# Patient Record
Sex: Female | Born: 1950 | Race: White | Hispanic: No | Smoking: Never smoker
Health system: Southern US, Community
[De-identification: ages and names within clinical notes are randomized; demographics above are authoritative.]

## PROBLEM LIST (undated history)

## (undated) DIAGNOSIS — E119 Type 2 diabetes mellitus without complications: Secondary | ICD-10-CM

## (undated) DIAGNOSIS — G4730 Sleep apnea, unspecified: Secondary | ICD-10-CM

## (undated) DIAGNOSIS — G2581 Restless legs syndrome: Secondary | ICD-10-CM

## (undated) DIAGNOSIS — R011 Cardiac murmur, unspecified: Secondary | ICD-10-CM

## (undated) DIAGNOSIS — I10 Essential (primary) hypertension: Secondary | ICD-10-CM

## (undated) HISTORY — PX: CHOLECYSTECTOMY: SHX55

## (undated) HISTORY — PX: APPENDECTOMY: SHX54

## (undated) HISTORY — PX: OTHER SURGICAL HISTORY: SHX169

## (undated) HISTORY — PX: WRIST SURGERY: SHX841

---

## 2019-11-26 ENCOUNTER — Encounter (HOSPITAL_BASED_OUTPATIENT_CLINIC_OR_DEPARTMENT_OTHER): Payer: Self-pay | Admitting: Emergency Medicine

## 2019-11-26 ENCOUNTER — Emergency Department (HOSPITAL_BASED_OUTPATIENT_CLINIC_OR_DEPARTMENT_OTHER): Payer: Medicare Other

## 2019-11-26 ENCOUNTER — Emergency Department (HOSPITAL_BASED_OUTPATIENT_CLINIC_OR_DEPARTMENT_OTHER)
Admission: EM | Admit: 2019-11-26 | Discharge: 2019-11-26 | Disposition: A | Discharge status: Home / Self Care | Payer: Medicare Other | Attending: Emergency Medicine | Admitting: Emergency Medicine

## 2019-11-26 ENCOUNTER — Other Ambulatory Visit: Payer: Self-pay

## 2019-11-26 DIAGNOSIS — R42 Dizziness and giddiness: Secondary | ICD-10-CM | POA: Diagnosis not present

## 2019-11-26 DIAGNOSIS — N390 Urinary tract infection, site not specified: Secondary | ICD-10-CM | POA: Insufficient documentation

## 2019-11-26 DIAGNOSIS — I10 Essential (primary) hypertension: Secondary | ICD-10-CM | POA: Insufficient documentation

## 2019-11-26 DIAGNOSIS — E119 Type 2 diabetes mellitus without complications: Secondary | ICD-10-CM | POA: Diagnosis not present

## 2019-11-26 DIAGNOSIS — R531 Weakness: Secondary | ICD-10-CM | POA: Diagnosis present

## 2019-11-26 HISTORY — DX: Type 2 diabetes mellitus without complications: E11.9

## 2019-11-26 LAB — CBC WITH DIFFERENTIAL/PLATELET
Abs Immature Granulocytes: 0.04 10*3/uL (ref 0.00–0.07)
Basophils Absolute: 0 10*3/uL (ref 0.0–0.1)
Basophils Relative: 0 %
Eosinophils Absolute: 0.1 10*3/uL (ref 0.0–0.5)
Eosinophils Relative: 1 %
HCT: 38.8 % (ref 36.0–46.0)
Hemoglobin: 12.9 g/dL (ref 12.0–15.0)
Immature Granulocytes: 0 %
Lymphocytes Relative: 20 %
Lymphs Abs: 1.8 10*3/uL (ref 0.7–4.0)
MCH: 30 pg (ref 26.0–34.0)
MCHC: 33.2 g/dL (ref 30.0–36.0)
MCV: 90.2 fL (ref 80.0–100.0)
Monocytes Absolute: 0.5 10*3/uL (ref 0.1–1.0)
Monocytes Relative: 5 %
Neutro Abs: 6.6 10*3/uL (ref 1.7–7.7)
Neutrophils Relative %: 74 %
Platelets: 238 10*3/uL (ref 150–400)
RBC: 4.3 MIL/uL (ref 3.87–5.11)
RDW: 12.9 % (ref 11.5–15.5)
WBC: 9.1 10*3/uL (ref 4.0–10.5)
nRBC: 0 % (ref 0.0–0.2)

## 2019-11-26 LAB — URINALYSIS, ROUTINE W REFLEX MICROSCOPIC
Bilirubin Urine: NEGATIVE
Glucose, UA: NEGATIVE mg/dL
Hgb urine dipstick: NEGATIVE
Ketones, ur: NEGATIVE mg/dL
Nitrite: NEGATIVE
Protein, ur: NEGATIVE mg/dL
Specific Gravity, Urine: 1.025 (ref 1.005–1.030)
pH: 5 (ref 5.0–8.0)

## 2019-11-26 LAB — COMPREHENSIVE METABOLIC PANEL
ALT: 21 U/L (ref 0–44)
AST: 21 U/L (ref 15–41)
Albumin: 3.9 g/dL (ref 3.5–5.0)
Alkaline Phosphatase: 62 U/L (ref 38–126)
Anion gap: 10 (ref 5–15)
BUN: 23 mg/dL (ref 8–23)
CO2: 27 mmol/L (ref 22–32)
Calcium: 9.4 mg/dL (ref 8.9–10.3)
Chloride: 103 mmol/L (ref 98–111)
Creatinine, Ser: 0.77 mg/dL (ref 0.44–1.00)
GFR calc Af Amer: >60 mL/min (ref >60)
GFR calc non Af Amer: >60 mL/min (ref >60)
Glucose, Bld: 152 mg/dL — ABNORMAL HIGH (ref 70–99)
Potassium: 3.5 mmol/L (ref 3.5–5.1)
Sodium: 140 mmol/L (ref 135–145)
Total Bilirubin: 0.4 mg/dL (ref 0.3–1.2)
Total Protein: 7 g/dL (ref 6.5–8.1)

## 2019-11-26 LAB — URINALYSIS, MICROSCOPIC (REFLEX): RBC / HPF: NONE SEEN RBC/hpf (ref 0–5)

## 2019-11-26 LAB — CBG MONITORING, ED: Glucose-Capillary: 156 mg/dL — ABNORMAL HIGH (ref 70–99)

## 2019-11-26 MED ORDER — CEPHALEXIN 250 MG PO CAPS
500.0000 mg | ORAL_CAPSULE | Freq: Once | ORAL | Status: AC
  Administered 2019-11-26: 500 mg via ORAL
  Filled 2019-11-26: qty 2

## 2019-11-26 MED ORDER — CEPHALEXIN 500 MG PO CAPS
500.0000 mg | ORAL_CAPSULE | Freq: Two times a day (BID) | ORAL | 0 refills | Status: DC
Start: 2019-11-26 — End: 2022-08-04

## 2019-11-26 NOTE — ED Notes (Signed)
Dr. Fredderick Phenix, ED Provider at bedside.

## 2019-11-26 NOTE — ED Provider Notes (Signed)
[facility] EMERGENCY DEPARTMENT Provider Note   CSN: [id number] Arrival date & time: 11/26/19  1726     History Chief Complaint  Patient presents with  . Weakness    Melissa Hester is a 69 y.o. female.  Patient is a 69 year old female with a history of hypertension, hyperlipidemia, diabetes and depression who presents with lightheadedness.  She says that she is just felt a little off for the last 2 days.  She gets lightheaded on standing.  She does not have any vertiginous type symptoms.  No associated nausea vomiting.  No ataxia although when she gets lightheaded she feels a little off balance.  No numbness or weakness to her extremities.  No loss of vision.  No speech deficits.  No recent illnesses.  No cough or cold symptoms.  No fevers.  No UTI symptoms.  No change in bowels.  No chest pain or shortness of breath.  No abdominal pain.  She does say that she has been a little down over the last couple days and has not been sleeping well.  She says sometimes she gets lonely and is little bit sad because her kids have moved out and her husband died 3 years ago.  I see on her medication list that she is on Zoloft although she tells me that she used to be on antidepressant but is not any longer.  She denies any thoughts of self-harm.        Past Medical History:  Diagnosis Date  . Diabetes mellitus without complication (HCC)     There are no problems to display for this patient.   Past Surgical History:  Procedure Laterality Date  . cesearan    . CHOLECYSTECTOMY       OB History   No obstetric history on file.     History reviewed. No pertinent family history.  Social History   Tobacco Use  . Smoking status: Never Smoker  Substance Use Topics  . Alcohol use: Not Currently  . Drug use: Never    Home Medications Prior to Admission medications   Medication Sig Start Date End Date Taking? Authorizing Provider  cephALEXin (KEFLEX) 500 MG capsule Take 1 capsule  (500 mg total) by mouth 2 (two) times daily. 11/26/19   Rolan Bucco, MD    Allergies    Ramipril and Codeine  Review of Systems   Review of Systems  Constitutional: Negative for chills, diaphoresis, fatigue and fever.  HENT: Negative for congestion, rhinorrhea and sneezing.   Eyes: Negative.   Respiratory: Negative for cough, chest tightness and shortness of breath.   Cardiovascular: Negative for chest pain and leg swelling.  Gastrointestinal: Negative for abdominal pain, blood in stool, diarrhea, nausea and vomiting.  Genitourinary: Negative for difficulty urinating, flank pain, frequency and hematuria.  Musculoskeletal: Negative for arthralgias and back pain.  Skin: Negative for rash.  Neurological: Positive for light-headedness. Negative for dizziness, speech difficulty, weakness, numbness and headaches.  Psychiatric/Behavioral: Positive for dysphoric mood. Negative for suicidal ideas.    Physical Exam Updated Vital Signs BP (!) 145/79   Pulse 78   Temp 98.8 F (37.1 C) (Oral)   Resp 18   Ht 5\' 3"  (1.6 m)   Wt 108.4 kg   LMP  (Approximate)   SpO2 96%   BMI 42.34 kg/m   Physical Exam Constitutional:      Appearance: She is well-developed.  HENT:     Head: Normocephalic and atraumatic.  Eyes:     Pupils: Pupils  are equal, round, and reactive to light.  Cardiovascular:     Rate and Rhythm: Normal rate and regular rhythm.     Heart sounds: Normal heart sounds.  Pulmonary:     Effort: Pulmonary effort is normal. No respiratory distress.     Breath sounds: Normal breath sounds. No wheezing or rales.  Chest:     Chest wall: No tenderness.  Abdominal:     General: Bowel sounds are normal.     Palpations: Abdomen is soft.     Tenderness: There is no abdominal tenderness. There is no guarding or rebound.  Musculoskeletal:        General: Normal range of motion.     Cervical back: Normal range of motion and neck supple.  Lymphadenopathy:     Cervical: No cervical  adenopathy.  Skin:    General: Skin is warm and dry.     Findings: No rash.  Neurological:     Mental Status: She is alert and oriented to person, place, and time.     Comments: Motor 5/5 all extremities Sensation grossly intact to LT all extremities Finger to Nose intact, no pronator drift CN II-XII grossly intact Gait normal      ED Results / Procedures / Treatments   Labs (all labs ordered are listed, but only abnormal results are displayed) Labs Reviewed  COMPREHENSIVE METABOLIC PANEL - Abnormal; Notable for the following components:      Result Value   Glucose, Bld 152 (*)    All other components within normal limits  URINALYSIS, ROUTINE W REFLEX MICROSCOPIC - Abnormal; Notable for the following components:   Leukocytes,Ua MODERATE (*)    All other components within normal limits  URINALYSIS, MICROSCOPIC (REFLEX) - Abnormal; Notable for the following components:   Bacteria, UA FEW (*)    All other components within normal limits  CBG MONITORING, ED - Abnormal; Notable for the following components:   Glucose-Capillary 156 (*)    All other components within normal limits  URINE CULTURE  CBC WITH DIFFERENTIAL/PLATELET    EKG None  Radiology CT Head Wo Contrast  Result Date: 11/26/2019 CLINICAL DATA:  Dizziness.  Weakness and fogginess. EXAM: CT HEAD WITHOUT CONTRAST TECHNIQUE: Contiguous axial images were obtained from the base of the skull through the vertex without intravenous contrast. COMPARISON:  January 22, 2018 FINDINGS: Brain: No subdural, epidural, or subarachnoid hemorrhage. Cerebellum, brainstem, and basal cisterns are normal. Ventricles and sulci are unremarkable. No mass effect or midline shift. Chronic white matter changes are identified, stable. No acute cortical ischemia or infarct. Vascular: No hyperdense vessel or unexpected calcification. Skull: Normal. Negative for fracture or focal lesion. Sinuses/Orbits: No acute finding. Other: None. IMPRESSION: Chronic  white matter changes.  No acute intracranial abnormalities. Electronically Signed   By: Gerome Sam III M.D   On: 11/26/2019 18:53    Procedures Procedures (including critical care time)  Medications Ordered in ED Medications  cephALEXin (KEFLEX) capsule 500 mg (has no administration in time range)    ED Course  I have reviewed the triage vital signs and the nursing notes.  Pertinent labs & imaging results that were available during my care of the patient were reviewed by me and considered in my medical decision making (see chart for details).    MDM Rules/Calculators/A&P                      Patient is a 69 year old female who presents with dizziness.  She has no  neurologic deficits.  No chest pain or other suggestions of ACS.  No arrhythmias noted on EKG.  Her labs are nonconcerning.  Her urine does show some signs of infection.  She will be started on Keflex.  She does seem to have some depression.  I actually did find out that she is still taking Zoloft.  She gets tearful during conversations but denies any SI/HI.  She has agreed to follow-up with her PCP this week and discuss these matters further.  She was advised to make a follow-up appointment on Tuesday with her PCP.  Return precautions were given. Final Clinical Impression(s) / ED Diagnoses Final diagnoses:  Dizziness  Urinary tract infection without hematuria, site unspecified    Rx / DC Orders ED Discharge Orders         Ordered    cephALEXin (KEFLEX) 500 MG capsule  2 times daily     11/26/19 2112           Malvin Johns, MD 11/26/19 2115

## 2019-11-26 NOTE — ED Triage Notes (Signed)
Pt c/o feeling weak and feeling "foggy" with dizziness and voicing concerns about b/p. X 2 days Pt denies HA, denies CP, endorses driving self to ED. Fasting CBG at home 158. AO x 4

## 2019-11-28 LAB — URINE CULTURE

## 2020-06-16 IMAGING — CT CT HEAD W/O CM
3 series · 16 of 47 positions shown, 19 images · non-contrast
Comparison: January 22, 2018

CLINICAL DATA: Dizziness.  Weakness and fogginess.

EXAM:
CT HEAD WITHOUT CONTRAST
TECHNIQUE: Contiguous axial images were obtained from the base of the skull
through the vertex without intravenous contrast.

[Series 2: head wo · axial · 0.49mm/px · z∈[-186,-46]mm · 10 of 34 slices shown, 13 images]
[im 3/34  brain]
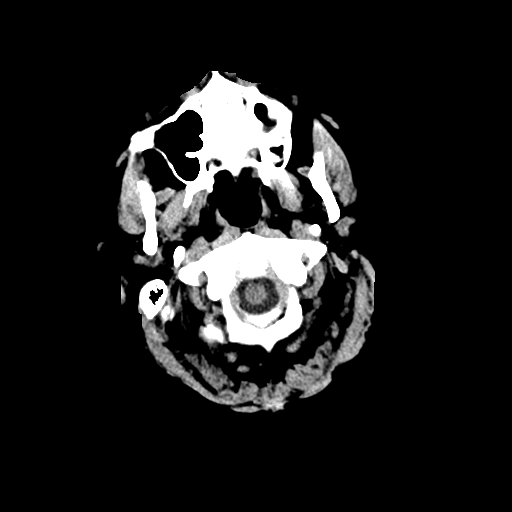
[im 3/34  bone]
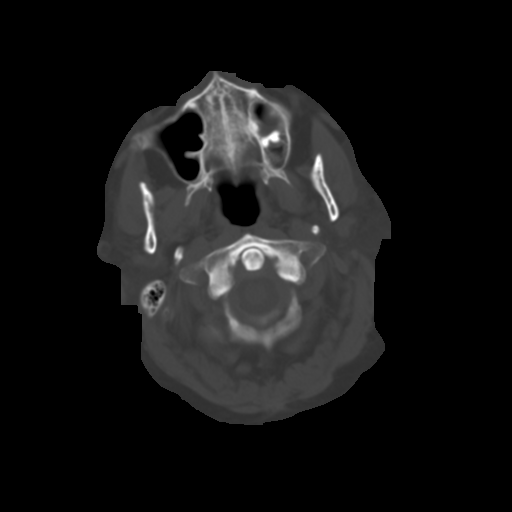
[im 6/34  brain]
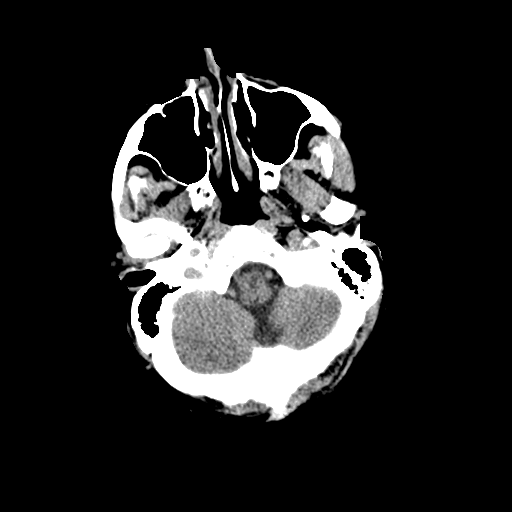
[im 10/34  brain]
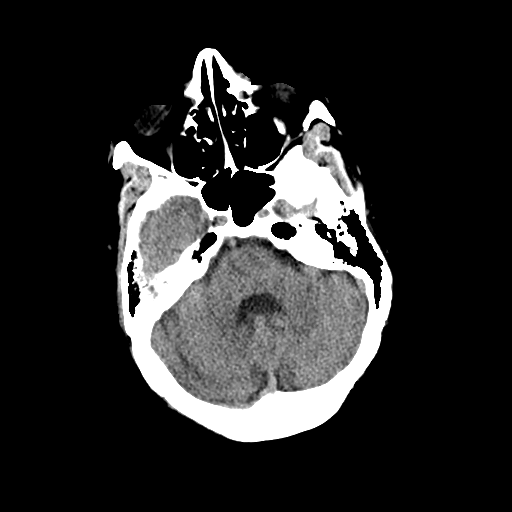
[im 12/34  brain]
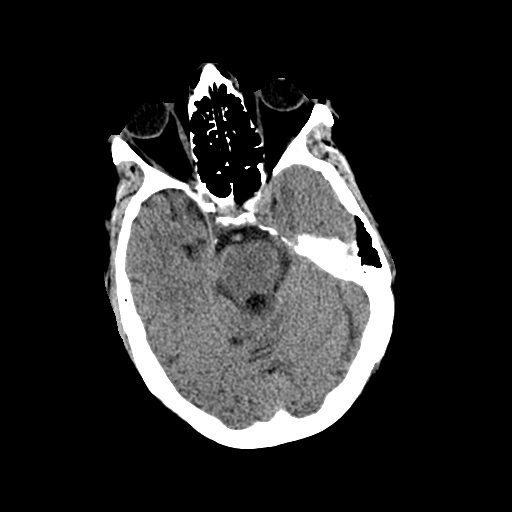
[im 15/34  brain]
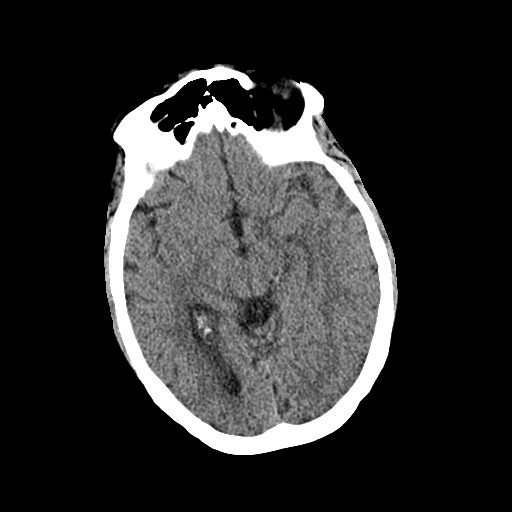
[im 15/34  bone]
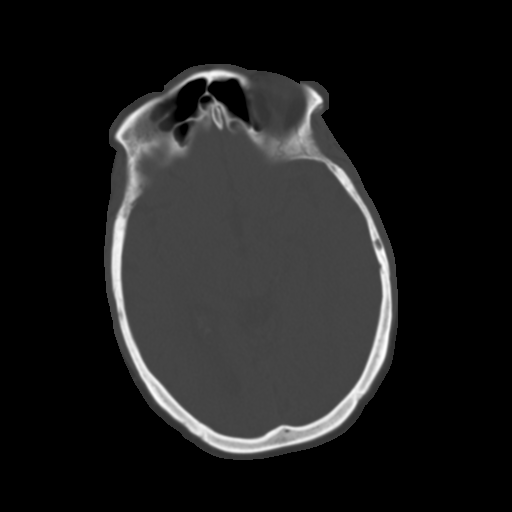
[im 19/34  brain]
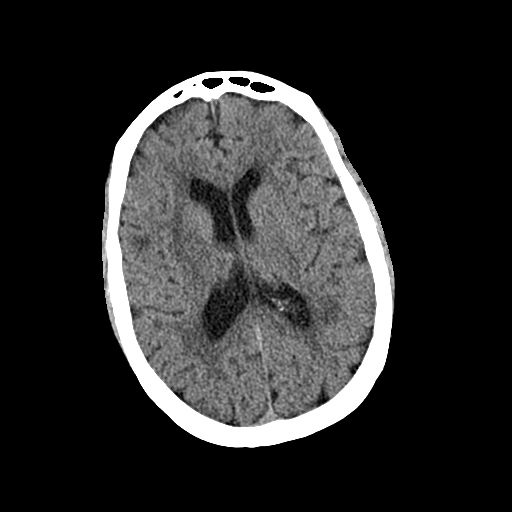
[im 22/34  brain]
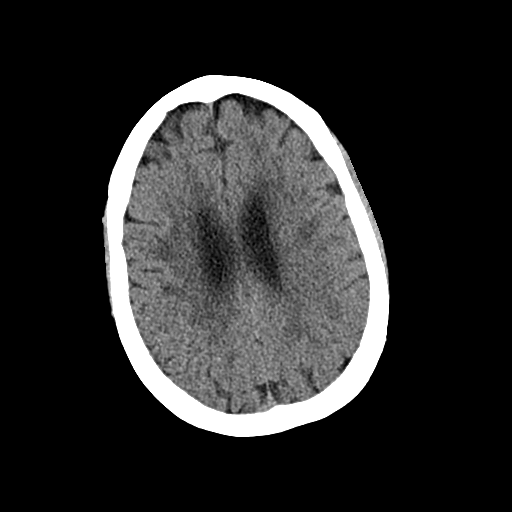
[im 26/34  brain]
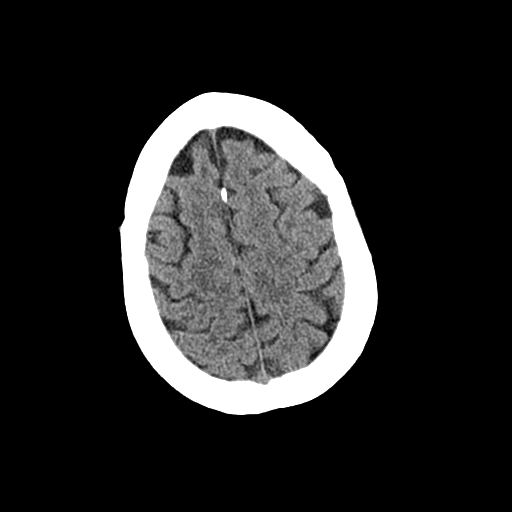
[im 28/34  brain]
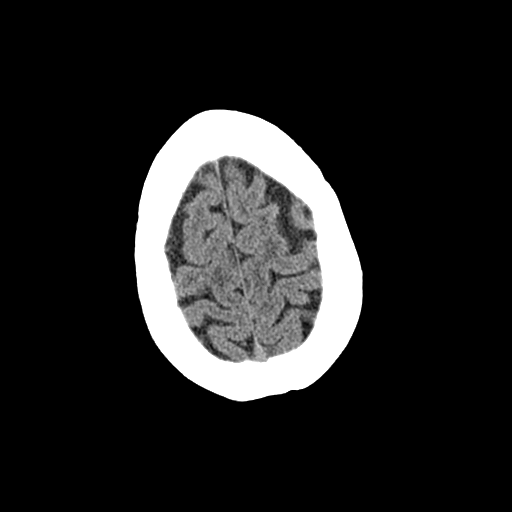
[im 28/34  bone]
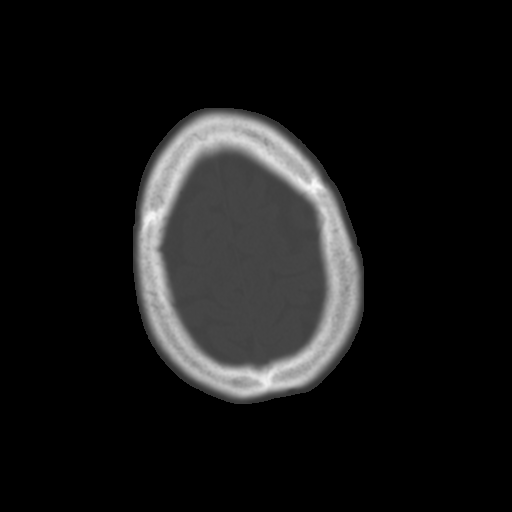
[im 31/34  brain]
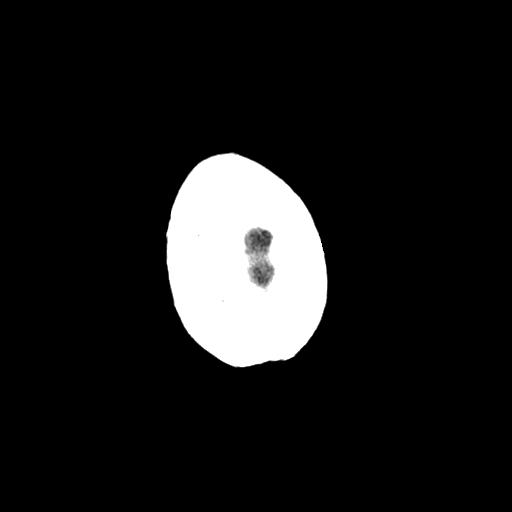

[Series 4: cor soft · coronal · 0.41mm/px · 3 of 84 slices shown]
[im 28/84  brain]
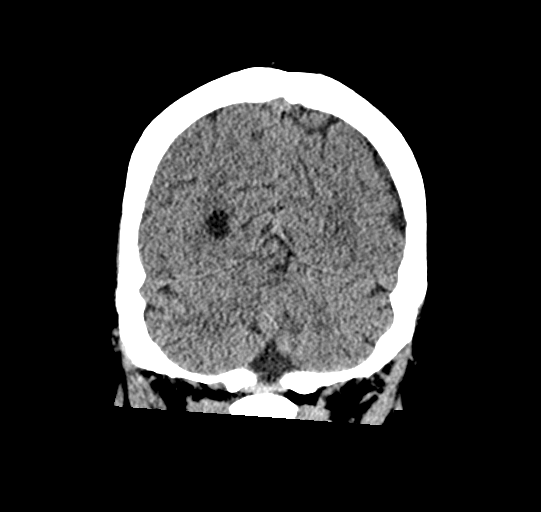
[im 37/84  brain]
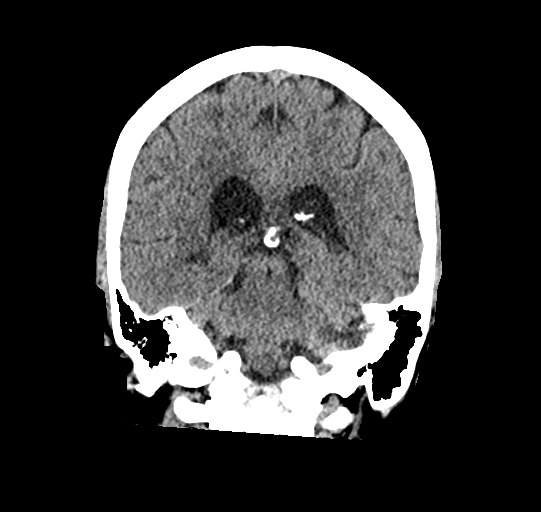
[im 47/84  brain]
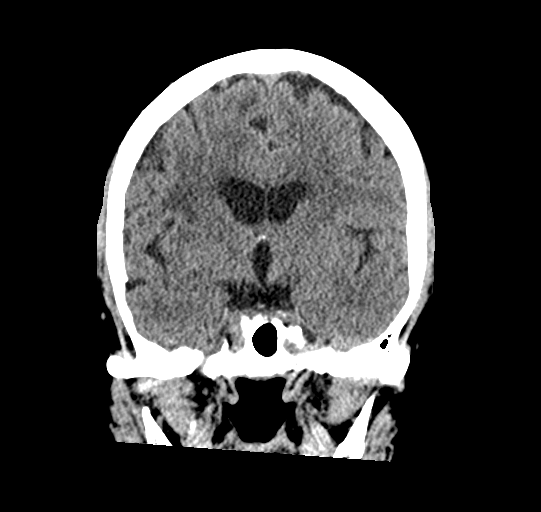

[Series 5: sag soft · sagittal · 0.41mm/px · 3 of 75 slices shown]
[im 25/75  brain]
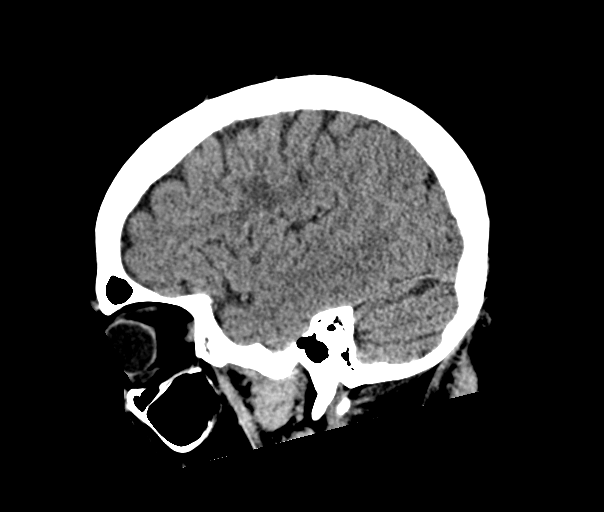
[im 38/75  brain]
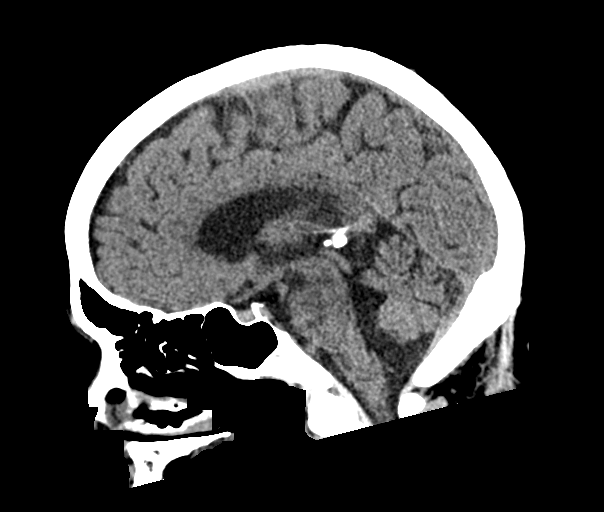
[im 50/75  brain]
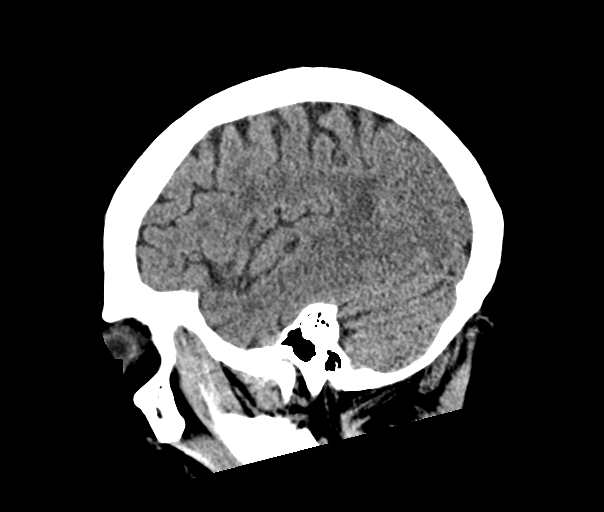

[16 of 47 positions shown; findings below may reference images not displayed]

FINDINGS: Brain: No subdural, epidural, or subarachnoid hemorrhage.
Cerebellum, brainstem, and basal cisterns are normal. Ventricles and
sulci are unremarkable. No mass effect or midline shift. Chronic
white matter changes are identified, stable. No acute cortical
ischemia or infarct.

Vascular: No hyperdense vessel or unexpected calcification.

Skull: Normal. Negative for fracture or focal lesion.

Sinuses/Orbits: No acute finding.

Other: None.
IMPRESSION: Chronic white matter changes.  No acute intracranial abnormalities.

## 2022-04-07 ENCOUNTER — Emergency Department (HOSPITAL_BASED_OUTPATIENT_CLINIC_OR_DEPARTMENT_OTHER)
Admission: EM | Admit: 2022-04-07 | Discharge: 2022-04-07 | Disposition: A | Discharge status: Home / Self Care | Payer: Medicare HMO | Attending: Emergency Medicine | Admitting: Emergency Medicine

## 2022-04-07 ENCOUNTER — Emergency Department (HOSPITAL_BASED_OUTPATIENT_CLINIC_OR_DEPARTMENT_OTHER): Payer: Medicare HMO

## 2022-04-07 ENCOUNTER — Encounter (HOSPITAL_BASED_OUTPATIENT_CLINIC_OR_DEPARTMENT_OTHER): Payer: Self-pay

## 2022-04-07 DIAGNOSIS — M25571 Pain in right ankle and joints of right foot: Secondary | ICD-10-CM | POA: Insufficient documentation

## 2022-04-07 NOTE — ED Provider Notes (Signed)
[facility] EMERGENCY DEPARTMENT Provider Note   CSN: [id number] Arrival date & time: 04/07/22  4332     History  No chief complaint on file.   Melissa Hester is a 71 y.o. female.  She is here with a complaint of right ankle pain its been going on for 2 weeks.  She denies any trauma.  Pain is worse with weightbearing and movement.  She has tried an ankle brace without any improvement.  No leg swelling numbness or weakness.  The history is provided by the patient.  Ankle Pain Location:  Ankle Time since incident:  2 weeks Injury: no   Ankle location:  R ankle Pain details:    Severity:  Moderate   Onset quality:  Gradual   Duration:  2 weeks   Timing:  Constant   Progression:  Unchanged Chronicity:  New Dislocation: no   Relieved by:  Nothing Worsened by:  Bearing weight and activity Ineffective treatments:  Immobilization Associated symptoms: no decreased ROM, no fever, no numbness and no tingling        Home Medications Prior to Admission medications   Medication Sig Start Date End Date Taking? Authorizing Provider  cephALEXin (KEFLEX) 500 MG capsule Take 1 capsule (500 mg total) by mouth 2 (two) times daily. 11/26/19   Malvin Johns, MD      Allergies    Ramipril and Codeine    Review of Systems   Review of Systems  Constitutional:  Negative for fever.  Skin:  Positive for wound.  Neurological:  Negative for weakness and numbness.    Physical Exam Updated Vital Signs BP (!) 140/74   Pulse 82   Temp 97.9 F (36.6 C) (Oral)   Resp 18   SpO2 95%  Physical Exam Vitals and nursing note reviewed.  Constitutional:      Appearance: Normal appearance. She is well-developed.  HENT:     Head: Normocephalic and atraumatic.  Eyes:     Conjunctiva/sclera: Conjunctivae normal.  Musculoskeletal:        General: Tenderness present. Normal range of motion.     Cervical back: Neck supple.     Comments: Right ankle she has some mild tenderness of her ATFL.   No significant effusion.  Medial aspect nontender.  No calcaneus fifth metatarsal tenderness.  No proximal leg tenderness.  Distal pulses motor and sensation intact.  Skin:    General: Skin is warm and dry.  Neurological:     General: No focal deficit present.     Mental Status: She is alert.     GCS: GCS eye subscore is 4. GCS verbal subscore is 5. GCS motor subscore is 6.     Sensory: No sensory deficit.     Motor: No weakness.     ED Results / Procedures / Treatments   Labs (all labs ordered are listed, but only abnormal results are displayed) Labs Reviewed - No data to display  EKG None  Radiology DG Ankle Complete Right  Result Date: 04/07/2022 CLINICAL DATA:  Right ankle pain and swelling. EXAM: RIGHT ANKLE - COMPLETE 3+ VIEW COMPARISON:  None Available. FINDINGS: No evidence of an acute fracture. No subluxation or dislocation. Cortical irregularity noted in the malleoli bilaterally is compatible with remote trauma. Extensive spurring/at the Z Opti at the Achilles tendon insertion may be related to chronic/repetitive trauma. IMPRESSION: 1. No acute bony abnormality. Electronically Signed   By: Misty Stanley M.D.   On: 04/07/2022 07:06    Procedures Procedures  Medications Ordered in ED Medications - No data to display  ED Course/ Medical Decision Making/ A&P                           Medical Decision Making  Differential diagnosis includes sprain, strain, contusion, fracture, dislocation.  X-rays ordered interpreted by me as no acute fracture or dislocation.  Reviewed with patient.  She already has a brace.  Recommended follow-up with PCP and also given contact information for sports medicine.  Return instructions discussed        Final Clinical Impression(s) / ED Diagnoses Final diagnoses:  Acute right ankle pain    Rx / DC Orders ED Discharge Orders     None         Hayden Rasmussen, MD 04/07/22 1725

## 2022-04-07 NOTE — ED Notes (Signed)
AVS provided to and discussed with patient. Pt verbalizes understanding of discharge instructions and denies any questions or concerns at this time. Pt ambulated out of department independently with steady gait.  

## 2022-04-07 NOTE — Discharge Instructions (Signed)
You were seen in the emergency department for right ankle pain.  Your x-ray did not show any fracture or dislocation.  Please continue to use your ankle support and you can do ice and ibuprofen.  Follow-up with your primary care doctor or Dr. Raeford Razor sports medicine.

## 2022-04-07 NOTE — ED Triage Notes (Signed)
Pt to ED by POV from home with c/o non-traumatic bilateral foot pain. Pt states she injured her left foot 2 weeks ago, and then injured the right foot last week. Denies any traumatic injuries. Feet are noted to be mildly swollen. Arrives A+O, NADN, ambulatory from the lobby.

## 2022-07-10 ENCOUNTER — Other Ambulatory Visit: Payer: Self-pay | Admitting: Orthopedic Surgery

## 2022-07-29 ENCOUNTER — Encounter (HOSPITAL_BASED_OUTPATIENT_CLINIC_OR_DEPARTMENT_OTHER): Payer: Self-pay | Admitting: Orthopedic Surgery

## 2022-07-29 ENCOUNTER — Other Ambulatory Visit: Payer: Self-pay

## 2022-07-31 ENCOUNTER — Encounter (HOSPITAL_BASED_OUTPATIENT_CLINIC_OR_DEPARTMENT_OTHER)
Admission: RE | Admit: 2022-07-31 | Discharge: 2022-07-31 | Disposition: A | Discharge status: Home / Self Care | Payer: Medicare HMO | Source: Ambulatory Visit | Attending: Orthopedic Surgery | Admitting: Orthopedic Surgery

## 2022-07-31 DIAGNOSIS — Z01818 Encounter for other preprocedural examination: Secondary | ICD-10-CM | POA: Diagnosis present

## 2022-07-31 LAB — BASIC METABOLIC PANEL
Anion gap: 7 (ref 5–15)
BUN: 20 mg/dL (ref 8–23)
CO2: 30 mmol/L (ref 22–32)
Calcium: 9.4 mg/dL (ref 8.9–10.3)
Chloride: 101 mmol/L (ref 98–111)
Creatinine, Ser: 0.79 mg/dL (ref 0.44–1.00)
GFR, Estimated: >60 mL/min (ref >60)
Glucose, Bld: 186 mg/dL — ABNORMAL HIGH (ref 70–99)
Potassium: 3.6 mmol/L (ref 3.5–5.1)
Sodium: 138 mmol/L (ref 135–145)

## 2022-07-31 NOTE — Progress Notes (Signed)

## 2022-08-04 ENCOUNTER — Ambulatory Visit (HOSPITAL_BASED_OUTPATIENT_CLINIC_OR_DEPARTMENT_OTHER): Payer: Medicare HMO | Admitting: Anesthesiology

## 2022-08-04 ENCOUNTER — Ambulatory Visit (HOSPITAL_BASED_OUTPATIENT_CLINIC_OR_DEPARTMENT_OTHER)
Admission: RE | Admit: 2022-08-04 | Discharge: 2022-08-04 | Disposition: A | Discharge status: Home / Self Care | Payer: Medicare HMO | Source: Ambulatory Visit | Attending: Orthopedic Surgery | Admitting: Orthopedic Surgery

## 2022-08-04 ENCOUNTER — Other Ambulatory Visit: Payer: Self-pay

## 2022-08-04 ENCOUNTER — Encounter (HOSPITAL_BASED_OUTPATIENT_CLINIC_OR_DEPARTMENT_OTHER)
Admission: RE | Disposition: A | Discharge status: Home / Self Care | Payer: Self-pay | Source: Ambulatory Visit | Attending: Orthopedic Surgery

## 2022-08-04 ENCOUNTER — Encounter (HOSPITAL_BASED_OUTPATIENT_CLINIC_OR_DEPARTMENT_OTHER): Payer: Self-pay | Admitting: Orthopedic Surgery

## 2022-08-04 DIAGNOSIS — G5602 Carpal tunnel syndrome, left upper limb: Secondary | ICD-10-CM | POA: Insufficient documentation

## 2022-08-04 DIAGNOSIS — Z6841 Body Mass Index (BMI) 40.0 and over, adult: Secondary | ICD-10-CM

## 2022-08-04 DIAGNOSIS — I10 Essential (primary) hypertension: Secondary | ICD-10-CM | POA: Diagnosis not present

## 2022-08-04 DIAGNOSIS — E119 Type 2 diabetes mellitus without complications: Secondary | ICD-10-CM

## 2022-08-04 HISTORY — DX: Cardiac murmur, unspecified: R01.1

## 2022-08-04 HISTORY — DX: Essential (primary) hypertension: I10

## 2022-08-04 HISTORY — PX: CARPAL TUNNEL RELEASE: SHX101

## 2022-08-04 HISTORY — DX: Restless legs syndrome: G25.81

## 2022-08-04 HISTORY — DX: Sleep apnea, unspecified: G47.30

## 2022-08-04 LAB — GLUCOSE, CAPILLARY
Glucose-Capillary: 98 mg/dL (ref 70–99)
Glucose-Capillary: 97 mg/dL (ref 70–99)

## 2022-08-04 SURGERY — CARPAL TUNNEL RELEASE
Anesthesia: Monitor Anesthesia Care | Site: Wrist | Laterality: Left

## 2022-08-04 MED ORDER — MIDAZOLAM HCL 2 MG/2ML IJ SOLN
INTRAMUSCULAR | Status: AC
  Filled 2022-08-04: qty 2

## 2022-08-04 MED ORDER — PROPOFOL 500 MG/50ML IV EMUL
INTRAVENOUS | Status: DC | PRN
  Administered 2022-08-04: 50 ug/kg/min via INTRAVENOUS

## 2022-08-04 MED ORDER — LIDOCAINE HCL (PF) 0.5 % IJ SOLN
INTRAMUSCULAR | Status: DC | PRN
  Administered 2022-08-04: 30 mL via INTRAVENOUS

## 2022-08-04 MED ORDER — FENTANYL CITRATE (PF) 100 MCG/2ML IJ SOLN: 25.0000 ug | INTRAMUSCULAR | Status: DC | PRN

## 2022-08-04 MED ORDER — MIDAZOLAM HCL 5 MG/5ML IJ SOLN
INTRAMUSCULAR | Status: DC | PRN
  Administered 2022-08-04: 2 mg via INTRAVENOUS

## 2022-08-04 MED ORDER — OXYCODONE HCL 5 MG PO TABS: 5.0000 mg | ORAL_TABLET | Freq: Once | ORAL | Status: DC | PRN

## 2022-08-04 MED ORDER — FENTANYL CITRATE (PF) 100 MCG/2ML IJ SOLN
INTRAMUSCULAR | Status: AC
  Filled 2022-08-04: qty 2

## 2022-08-04 MED ORDER — ONDANSETRON HCL 4 MG/2ML IJ SOLN
INTRAMUSCULAR | Status: DC | PRN
  Administered 2022-08-04: 4 mg via INTRAVENOUS

## 2022-08-04 MED ORDER — HYDROCODONE-ACETAMINOPHEN 5-325 MG PO TABS: ORAL_TABLET | ORAL | 0 refills | Status: DC

## 2022-08-04 MED ORDER — BUPIVACAINE HCL (PF) 0.25 % IJ SOLN
INTRAMUSCULAR | Status: DC | PRN
  Administered 2022-08-04: 9 mL

## 2022-08-04 MED ORDER — LACTATED RINGERS IV SOLN: INTRAVENOUS | Status: DC

## 2022-08-04 MED ORDER — ONDANSETRON HCL 4 MG/2ML IJ SOLN: 4.0000 mg | Freq: Four times a day (QID) | INTRAMUSCULAR | Status: DC | PRN

## 2022-08-04 MED ORDER — CEFAZOLIN SODIUM-DEXTROSE 2-4 GM/100ML-% IV SOLN
INTRAVENOUS | Status: AC
  Filled 2022-08-04: qty 100

## 2022-08-04 MED ORDER — 0.9 % SODIUM CHLORIDE (POUR BTL) OPTIME
TOPICAL | Status: DC | PRN
  Administered 2022-08-04: 50 mL

## 2022-08-04 MED ORDER — CEFAZOLIN SODIUM-DEXTROSE 2-4 GM/100ML-% IV SOLN
2.0000 g | INTRAVENOUS | Status: AC
  Administered 2022-08-04: 2 g via INTRAVENOUS

## 2022-08-04 MED ORDER — FENTANYL CITRATE (PF) 100 MCG/2ML IJ SOLN
INTRAMUSCULAR | Status: DC | PRN
  Administered 2022-08-04 (×2): 50 ug via INTRAVENOUS

## 2022-08-04 MED ORDER — OXYCODONE HCL 5 MG/5ML PO SOLN: 5.0000 mg | Freq: Once | ORAL | Status: DC | PRN

## 2022-08-04 SURGICAL SUPPLY — 34 items
APL PRP STRL LF DISP 70% ISPRP (MISCELLANEOUS) ×1
BLADE SURG 15 STRL LF DISP TIS (BLADE) ×2 IMPLANT
BLADE SURG 15 STRL SS (BLADE) ×2
BNDG CMPR 9X4 STRL LF SNTH (GAUZE/BANDAGES/DRESSINGS) ×1
BNDG ELASTIC 3X5.8 VLCR STR LF (GAUZE/BANDAGES/DRESSINGS) ×1 IMPLANT
BNDG ESMARK 4X9 LF (GAUZE/BANDAGES/DRESSINGS) IMPLANT
BNDG GAUZE DERMACEA FLUFF 4 (GAUZE/BANDAGES/DRESSINGS) ×1 IMPLANT
BNDG GZE DERMACEA 4 6PLY (GAUZE/BANDAGES/DRESSINGS) ×1
CHLORAPREP W/TINT 26 (MISCELLANEOUS) ×1 IMPLANT
CORD BIPOLAR FORCEPS 12FT (ELECTRODE) ×1 IMPLANT
COVER BACK TABLE 60X90IN (DRAPES) ×1 IMPLANT
COVER MAYO STAND STRL (DRAPES) ×1 IMPLANT
CUFF TOURN SGL QUICK 18X4 (TOURNIQUET CUFF) ×1 IMPLANT
DRAPE EXTREMITY T 121X128X90 (DISPOSABLE) ×1 IMPLANT
DRAPE SURG 17X23 STRL (DRAPES) ×1 IMPLANT
GAUZE PAD ABD 8X10 STRL (GAUZE/BANDAGES/DRESSINGS) ×1 IMPLANT
GAUZE SPONGE 4X4 12PLY STRL (GAUZE/BANDAGES/DRESSINGS) ×1 IMPLANT
GAUZE XEROFORM 1X8 LF (GAUZE/BANDAGES/DRESSINGS) ×1 IMPLANT
GLOVE BIO SURGEON STRL SZ7.5 (GLOVE) ×1 IMPLANT
GLOVE BIOGEL PI IND STRL 8 (GLOVE) ×1 IMPLANT
GOWN STRL REUS W/ TWL LRG LVL3 (GOWN DISPOSABLE) ×1 IMPLANT
GOWN STRL REUS W/TWL LRG LVL3 (GOWN DISPOSABLE) ×1
GOWN STRL REUS W/TWL XL LVL3 (GOWN DISPOSABLE) ×1 IMPLANT
NDL HYPO 25X1 1.5 SAFETY (NEEDLE) ×1 IMPLANT
NEEDLE HYPO 25X1 1.5 SAFETY (NEEDLE) ×1 IMPLANT
NS IRRIG 1000ML POUR BTL (IV SOLUTION) ×1 IMPLANT
PACK BASIN DAY SURGERY FS (CUSTOM PROCEDURE TRAY) ×1 IMPLANT
PADDING CAST ABS COTTON 4X4 ST (CAST SUPPLIES) ×1 IMPLANT
STOCKINETTE 4X48 STRL (DRAPES) ×1 IMPLANT
SUT ETHILON 4 0 PS 2 18 (SUTURE) ×1 IMPLANT
SYR BULB EAR ULCER 3OZ GRN STR (SYRINGE) ×1 IMPLANT
SYR CONTROL 10ML LL (SYRINGE) ×1 IMPLANT
TOWEL GREEN STERILE FF (TOWEL DISPOSABLE) ×2 IMPLANT
UNDERPAD 30X36 HEAVY ABSORB (UNDERPADS AND DIAPERS) ×1 IMPLANT

## 2022-08-04 NOTE — Anesthesia Postprocedure Evaluation (Signed)
Anesthesia Post Note  Patient: Melissa Hester  Procedure(s) Performed: LEFT CARPAL TUNNEL RELEASE (Left: Wrist)     Patient location during evaluation: PACU Anesthesia Type: MAC Level of consciousness: awake and alert Pain management: pain level controlled Vital Signs Assessment: post-procedure vital signs reviewed and stable Respiratory status: spontaneous breathing, nonlabored ventilation, respiratory function stable and patient connected to nasal cannula oxygen Cardiovascular status: stable and blood pressure returned to baseline Postop Assessment: no apparent nausea or vomiting Anesthetic complications: no  No notable events documented.  Last Vitals:  Vitals:   08/04/22 1500 08/04/22 1512  BP: (!) 145/71 (!) 150/70  Pulse: 76 66  Resp: 18 18  Temp:  36.6 C  SpO2: 96% 93%    Last Pain:  Vitals:   08/04/22 1512  TempSrc: Oral  PainSc: 0-No pain                 Barnet Glasgow

## 2022-08-04 NOTE — Op Note (Addendum)
08/04/2022 [facility]                              OPERATIVE REPORT   PREOPERATIVE DIAGNOSIS:  Left carpal tunnel syndrome.  POSTOPERATIVE DIAGNOSIS:  Left carpal tunnel syndrome.  PROCEDURE:  Left carpal tunnel release.  SURGEON:  [name], MD  ASSISTANT:  none.  ANESTHESIA: Bier block with sedation  IV FLUIDS:  Per anesthesia flow sheet.  ESTIMATED BLOOD LOSS:  Minimal.  COMPLICATIONS:  None.  SPECIMENS:  None.  TOURNIQUET TIME:    Total Tourniquet Time Documented: Forearm (Left) - 22 minutes Total: Forearm (Left) - 22 minutes   DISPOSITION:  Stable to PACU.  LOCATION: [facility]  INDICATIONS:  72 y.o. yo female with numbness and tingling left hand.  Positive nerve conduction studies. She wishes to have left carpal tunnel release.  She wishes to have a carpal tunnel release for management of her symptoms.  Risks, benefits and alternatives of surgery were discussed including the risk of blood loss; infection; damage to nerves, vessels, tendons, ligaments, bone; failure of surgery; need for additional surgery; complications with wound healing; continued pain; recurrence of carpal tunnel syndrome; and damage to motor branch. She voiced understanding of these risks and elected to proceed.   OPERATIVE COURSE:  After being identified preoperatively by myself, the patient and I agreed upon the procedure and site of procedure.  The surgical site was marked.  Surgical consent had been signed.  She was given IV Ancef as preoperative antibiotic prophylaxis.  She was transferred to the operating room and placed on the operating room table in supine position with the Left upper extremity on an armboard.  Bier block anesthesia was induced by the anesthesiologist.  Left upper extremity was prepped and draped in normal sterile orthopaedic fashion.  A surgical pause was performed between the surgeons, anesthesia, and operating room staff, and all were in  agreement as to the patient, procedure, and site of procedure.  Tourniquet at the proximal aspect of the forearm had been inflated for the Bier block  Incision was made over the transverse carpal ligament and carried into the subcutaneous tissues by spreading technique.  Bipolar electrocautery was used to obtain hemostasis.  The palmar fascia was sharply incised.  The transverse carpal ligament was identified.  The fascia distal to the ligament was opened.  The transverse carpal ligament was then incised from distal to proximal under direct visualization.  Scissors were used to split the distal aspect of the volar antebrachial fascia.  A finger was placed into the wound to ensure complete decompression, which was the case.  The nerve was examined.  There was an hourglass deformity.  The motor branch was identified and was intact.  The wound was copiously irrigated with sterile saline.  It was then closed with 4-0 nylon in a horizontal mattress fashion.  It was injected with 0.25% plain Marcaine to aid in postoperative analgesia.  It was dressed with sterile Xeroform, 4x4s, an ABD, and wrapped with Kerlix and an Ace bandage.  Tourniquet was deflated at 22 minutes.  Fingertips were pink with brisk capillary refill after deflation of the tourniquet.  Operative drapes were broken down.  The patient was awoken from anesthesia safely.  She was transferred back to stretcher and taken to the PACU in stable condition.  I will see her back in the office in 1 week for postoperative followup.  I will  give her a prescription for Norco 5/325 1-2 tabs PO q6 hours prn pain, dispense # 15.    [name], MD Electronically signed, 08/04/22

## 2022-08-04 NOTE — Anesthesia Preprocedure Evaluation (Addendum)
Anesthesia Evaluation  Patient identified by MRN, date of birth, ID band Patient awake    Reviewed: Allergy & Precautions, H&P , NPO status , Patient's Chart, lab work & pertinent test results  Airway Mallampati: II   Neck ROM: full    Dental   Pulmonary sleep apnea    breath sounds clear to auscultation       Cardiovascular hypertension,  Rhythm:regular Rate:Normal     Neuro/Psych    GI/Hepatic   Endo/Other  diabetes, Type 2  Morbid obesity  Renal/GU      Musculoskeletal   Abdominal   Peds  Hematology   Anesthesia Other Findings   Reproductive/Obstetrics                             Anesthesia Physical Anesthesia Plan  ASA: 3  Anesthesia Plan: MAC and Bier Block and Bier Block-Lidocaine Only   Post-op Pain Management:    Induction: Intravenous  PONV Risk Score and Plan: 2 and Propofol infusion and Treatment may vary due to age or medical condition  Airway Management Planned: Simple Face Mask  Additional Equipment:   Intra-op Plan:   Post-operative Plan:   Informed Consent: I have reviewed the patients History and Physical, chart, labs and discussed the procedure including the risks, benefits and alternatives for the proposed anesthesia with the patient or authorized representative who has indicated his/her understanding and acceptance.     Dental advisory given  Plan Discussed with: CRNA, Anesthesiologist and Surgeon  Anesthesia Plan Comments:        Anesthesia Quick Evaluation

## 2022-08-04 NOTE — Discharge Instructions (Addendum)

## 2022-08-04 NOTE — Transfer of Care (Signed)
Immediate Anesthesia Transfer of Care Note  Patient: Melissa Hester  Procedure(s) Performed: LEFT CARPAL TUNNEL RELEASE (Left: Wrist)  Patient Location: PACU  Anesthesia Type:MAC and Bier block  Level of Consciousness: awake, alert , and patient cooperative  Airway & Oxygen Therapy: Patient Spontanous Breathing  Post-op Assessment: Report given to RN and Post -op Vital signs reviewed and stable  Post vital signs: Reviewed and stable  Last Vitals:  Vitals Value Taken Time  BP 152/72 08/04/22 1435  Temp    Pulse 79 08/04/22 1435  Resp 21 08/04/22 1435  SpO2 96 % 08/04/22 1435  Vitals shown include unvalidated device data.  Last Pain:  Vitals:   08/04/22 1246  TempSrc: Oral  PainSc: 0-No pain         Complications: No notable events documented.

## 2022-08-04 NOTE — Anesthesia Procedure Notes (Signed)
Anesthesia Regional Block: Bier block (IV Regional)   Pre-Anesthetic Checklist: , timeout performed,  Correct Patient, Correct Site, Correct Laterality,  Correct Procedure, Correct Position, site marked,  Risks and benefits discussed,  Surgical consent,  Pre-op evaluation,  At surgeon's request  Laterality: Left  Prep: alcohol swabs        Procedures:,,,,, intact distal pulses, Esmarch exsanguination,  Single tourniquet utilized,  #20gu IV placed    Narrative:  CRNA: Verita Lamb, CRNA

## 2022-08-04 NOTE — H&P (Signed)
Melissa Hester is an 72 y.o. female.   Chief Complaint: carpal tunnel syndrome HPI: 72 y.o. yo female with numbness and tingling left hand.  Positive nerve conduction studies. She wishes to have left carpal tunnel release.   Allergies:  Allergies  Allergen Reactions   Ramipril Other (See Comments)    Other reaction(s): Cough (ALLERGY/intolerance) Other reaction(s): Cough (ALLERGY/intolerance) Cough    Codeine Nausea And Vomiting    Past Medical History:  Diagnosis Date   Diabetes mellitus without complication (HCC)    Heart murmur    benign   Hypertension    Restless legs syndrome (RLS)    Sleep apnea    uses CPAP    Past Surgical History:  Procedure Laterality Date   APPENDECTOMY     cesearan     CHOLECYSTECTOMY     WRIST SURGERY Left     Family History: History reviewed. No pertinent family history.  Social History:   reports that she has never smoked. She does not have any smokeless tobacco history on file. She reports that she does not currently use alcohol. She reports that she does not use drugs.  Medications: Medications Prior to Admission  Medication Sig Dispense Refill   atorvastatin (LIPITOR) 20 MG tablet Take 20 mg by mouth daily.     calcium-vitamin D (OSCAL WITH D) 500-5 MG-MCG tablet Take 1 tablet by mouth.     hydrochlorothiazide (HYDRODIURIL) 25 MG tablet Take 25 mg by mouth 2 (two) times daily.     meloxicam (MOBIC) 15 MG tablet Take 15 mg by mouth daily.     metFORMIN (GLUCOPHAGE) 1000 MG tablet Take 1,000 mg by mouth 2 (two) times daily with a meal.     Multiple Vitamins-Minerals (WOMENS MULTIVITAMIN PO) Take by mouth.     pramipexole (MIRAPEX) 1 MG tablet Take 1 mg by mouth in the morning and at bedtime.     sertraline (ZOLOFT) 50 MG tablet Take 50 mg by mouth daily.      Results for orders placed or performed during the hospital encounter of 08/04/22 (from the past 48 hour(s))  Glucose, capillary     Status: None   Collection Time: 08/04/22  12:42 PM  Result Value Ref Range   Glucose-Capillary 98 70 - 99 mg/dL    Comment: Glucose reference range applies only to samples taken after fasting for at least 8 hours.    No results found.    Blood pressure (!) 165/78, pulse 79, temperature 97.9 F (36.6 C), temperature source Oral, resp. rate 18, height 5\' 1"  (1.549 m), weight 110 kg, SpO2 97 %.  General appearance: alert, cooperative, and appears stated age Head: Normocephalic, without obvious abnormality, atraumatic Neck: supple, symmetrical, trachea midline Extremities: Intact sensation and capillary refill all digits.  +epl/fpl/io.  No wounds.  Pulses: 2+ and symmetric Skin: Skin color, texture, turgor normal. No rashes or lesions Neurologic: Grossly normal Incision/Wound: none  Assessment/Plan Left carpal tunnel syndrome.  Non operative and operative treatment options have been discussed with the patient and patient wishes to proceed with operative treatment. Risks, benefits, and alternatives of surgery have been discussed and the patient agrees with the plan of care.   Melissa Hester 08/04/2022, 1:11 PM

## 2022-08-05 ENCOUNTER — Encounter (HOSPITAL_BASED_OUTPATIENT_CLINIC_OR_DEPARTMENT_OTHER): Payer: Self-pay | Admitting: Orthopedic Surgery

## 2022-08-05 NOTE — Progress Notes (Signed)
Left message stating courtesy call and if any questions or concerns please call the doctors office.  

## 2024-05-30 NOTE — H&P (Signed)
 TOTAL KNEE ADMISSION H&P  Patient is being admitted for right total knee arthroplasty.  Subjective:  Chief Complaint: Right knee pain.  HPI: Melissa Hester, 73 y.o. female has a history of pain and functional disability in the right knee due to arthritis and has failed non-surgical conservative treatments for greater than 12 weeks to include corticosteriod injections and activity modification. Onset of symptoms was gradual, starting several years ago with gradually worsening course since that time. The patient noted no past surgery on the right knee.  Patient currently rates pain in the right knee at 8 out of 10 with activity. Patient has night pain, worsening of pain with activity and weight bearing, pain with passive range of motion, and crepitus. Patient has evidence of bone-on-bone arthritis in the medial and patellofemoral compartments of both knees, with significant varus deformity and tibial subluxation bilaterally. Very large osteophytes are also noted by imaging studies. There is no active infection.  There are no active problems to display for this patient.   Past Medical History:  Diagnosis Date   Diabetes mellitus without complication (HCC)    Heart murmur    benign   Hypertension    Restless legs syndrome (RLS)    Sleep apnea    uses CPAP    Past Surgical History:  Procedure Laterality Date   APPENDECTOMY     CARPAL TUNNEL RELEASE Left 08/04/2022   Procedure: LEFT CARPAL TUNNEL RELEASE;  Surgeon: Murrell Drivers, MD;  Location: [facility];  Service: Orthopedics;  Laterality: Left;  30 MIN   cesearan     CHOLECYSTECTOMY     WRIST SURGERY Left     Prior to Admission medications   Medication Sig Start Date End Date Taking? Authorizing Provider  atorvastatin (LIPITOR) 20 MG tablet Take 20 mg by mouth daily.    [provider]  calcium-vitamin D (OSCAL WITH D) 500-5 MG-MCG tablet Take 1 tablet by mouth.    [provider]  hydrochlorothiazide  (HYDRODIURIL) 25 MG tablet Take 25 mg by mouth 2 (two) times daily.    [provider]  HYDROcodone -acetaminophen  (NORCO/VICODIN) 5-325 MG tablet 1-2 tabs PO q6 hours prn pain 08/04/22   Kuzma, Kevin, MD  meloxicam (MOBIC) 15 MG tablet Take 15 mg by mouth daily.    [provider]  metFORMIN (GLUCOPHAGE) 1000 MG tablet Take 1,000 mg by mouth 2 (two) times daily with a meal.    [provider]  Multiple Vitamins-Minerals (WOMENS MULTIVITAMIN PO) Take by mouth.    [provider]  pramipexole (MIRAPEX) 1 MG tablet Take 1 mg by mouth in the morning and at bedtime.    [provider]  sertraline (ZOLOFT) 50 MG tablet Take 50 mg by mouth daily.    [provider]    Allergies  Allergen Reactions   Ramipril Other (See Comments)    Other reaction(s): Cough (ALLERGY/intolerance) Other reaction(s): Cough (ALLERGY/intolerance) Cough    Codeine Nausea And Vomiting    Social History   Socioeconomic History   Marital status: Widowed    Spouse name: Not on file   Number of children: Not on file   Years of education: Not on file   Highest education level: Not on file  Occupational History   Not on file  Tobacco Use   Smoking status: Never   Smokeless tobacco: Not on file  Vaping Use   Vaping status: Never Used  Substance and Sexual Activity   Alcohol use: Not Currently   Drug  use: Never   Sexual activity: Not on file  Other Topics Concern   Not on file  Social History Narrative   Not on file   Social Drivers of Health   Financial Resource Strain: Not on file  Food Insecurity: Low Risk  (02/23/2024)   Received from [facility]   Hunger Vital Sign    Within the past 12 months, you worried that your food would run out before you got money to buy more: Never true    Within the past 12 months, the food you bought just didn't last and you didn't have money to get more. : Never true  Transportation Needs: No Transportation Needs  (02/23/2024)   Received from [facility]    In the past 12 months, has lack of reliable transportation kept you from medical appointments, meetings, work or from getting things needed for daily living? : No  Physical Activity: Not on file  Stress: Not on file  Social Connections: Unknown (11/10/2021)   [facility]   Social Network    Social Network: Not on file  Intimate Partner Violence: Unknown (10/02/2021)   Received from [facility]   HITS    Physically Hurt: Not on file    Insult or Talk Down To: Not on file    Threaten Physical Harm: Not on file    Scream or Curse: Not on file    Tobacco Use: Low Risk  (03/29/2024)   Received from [facility]   Patient History    Passive Exposure: Past    Smoking Tobacco Use: Never    Smokeless Tobacco Use: Never   Social History   Substance and Sexual Activity  Alcohol Use Not Currently    No family history on file.  Review of Systems  Constitutional:  Negative for chills and fever.  HENT:  Negative for congestion, sore throat and tinnitus.   Eyes:  Negative for double vision, photophobia and pain.  Respiratory:  Negative for cough, shortness of breath and wheezing.   Cardiovascular:  Negative for chest pain, palpitations and orthopnea.  Gastrointestinal:  Negative for heartburn, nausea and vomiting.  Genitourinary:  Negative for dysuria, frequency and urgency.  Musculoskeletal:  Positive for joint pain.  Neurological:  Negative for dizziness, weakness and headaches.    Objective:  Physical Exam:  Well nourished and well developed.  General: Alert and oriented x3, cooperative and pleasant, no acute distress.  Head: normocephalic, atraumatic, neck supple.  Eyes: EOMI.  Musculoskeletal:  Evaluation of the right knee demonstrates no effusion. - Varus deformity present. - Range of motion: 5-90 degrees. - Marked crepitus with range of motion. - Tenderness medially; no lateral tenderness  or instability noted.  Calves soft and nontender. Motor function intact in LE. Strength 5/5 LE bilaterally. Neuro: Distal pulses 2+. Sensation to light touch intact in LE.  Imaging Review Plain radiographs demonstrate severe degenerative joint disease of the right knee. The overall alignment is mild varus. The bone quality appears to be adequate for age and reported activity level.  Assessment/Plan:  End stage arthritis, right knee   The patient history, physical examination, clinical judgment of the provider and imaging studies are consistent with end stage degenerative joint disease of the right knee and total knee arthroplasty is deemed medically necessary. The treatment options including medical management, injection therapy arthroscopy and arthroplasty were discussed at length. The risks and benefits of total knee arthroplasty were presented and reviewed. The risks due to aseptic loosening,  infection, stiffness, patella tracking problems, thromboembolic complications and other imponderables were discussed. The patient acknowledged the explanation, agreed to proceed with the plan and consent was signed. Patient is being admitted for inpatient treatment for surgery, pain control, PT, OT, prophylactic antibiotics, VTE prophylaxis, progressive ambulation and ADLs and discharge planning. The patient is planning to be discharged home.   Patient's anticipated LOS is less than 2 midnights, meeting these requirements: - Lives within 1 hour of care - Has a competent adult at home to recover with post-op recover - NO history of  - Chronic pain requiring opioids  - Diabetes  - Coronary Artery Disease  - Heart failure  - Heart attack  - Stroke  - DVT/VTE  - Cardiac arrhythmia  - Respiratory Failure/COPD  - Renal failure  - Anemia  - Advanced Liver disease  Therapy Plans: Outpatient therapy at [facility]) Disposition: Home with daughters Planned DVT Prophylaxis: Aspirin 81 mg BID DME  Needed: Planning to borrow, if cannot will call PCP: Duwaine Portugal, DO (appt 12/1) TXA: IV Allergies: Ramipril, codeine Metal Allergy: None Anesthesia Concerns: None BMI: 39.5 Last HgbA1c: 7.1% Pain Regimen: Oxycodone  [facility]: [facility]  Other:  - Last dose of mounjaro 12/5  - Patient was instructed on what medications to stop prior to surgery. - Follow-up visit in 2 weeks with Dr. Melodi - Begin physical therapy following surgery - Pre-operative lab work as pre-surgical testing - Prescriptions will be provided in hospital at time of discharge  Melissa Mess, PA-C Orthopedic Surgery [facility] [place]

## 2024-06-06 NOTE — Progress Notes (Signed)
 This patient's surgery was cancelled for 06-13-24  TKA;  cardiac clearance was delayed d/t needing Stress test.   COVID Vaccine received:  []  No []  Yes Date of any COVID positive Test in last 90 days:  PCP - Duwaine Portugal, DO [phone] (Work)  [phone] (Fax)   not cleared until after Stress Test is completed,  Cardiologist -  none  Chest x-ray -  EKG -  07-31-2022 Epic    Stress Test -   Preop ordered by PCP  ECHO - 10-02-2023   Cardiac Cath -  CT Coronary Calcium score:   Pacemaker / ICD device [x]  No []  Yes   Spinal Cord Stimulator:[x]  No []  Yes       History of Sleep Apnea? []  No [x]  Yes   CPAP used?- []  No []  Yes    Medication on DOS: Pramipexole (Mirapex), Tylenol  Hold DOS:  HCTZ    Patient has: []  NO Hx DM   []  Pre-DM   []  DM1  [x]   DM2 Does the patient monitor blood sugar?   []  N/A   []  No []  Yes  Last A1c was: 5.4  on 05-30-2024      Does patient have a [employer] or Dexcom? []  No []  Yes   Fasting Blood Sugar Ranges-  Checks Blood Sugar _____ times a day  Tirzepatide CLOYDE) last injection: 06-03-2024 per H&P Metformin (GLUCOPHAGE)- Hold DOS  Blood Thinner / Instructions:  none Aspirin Instructions:  none  Activity level: Able to walk up 2 flights of stairs without becoming significantly short of breath or having chest pain?  []  No   []    Yes  Patient can perform ADLs without assistance. []  No   []   Yes  Comments:   Anesthesia review:  DM2, HTN, Heart Murmur (Benign- ECHO 10-02-2023 ) RLS, OSA- CPAP,   Patient denies any S&S of respiratory illness or Covid - no shortness of breath, fever, cough or chest pain at PAT appointment.  Patient verbalized understanding and agreement to the Pre-Surgical Instructions that were given to them at this PAT appointment. Patient was also educated of the need to review these PAT instructions again prior to her surgery.I reviewed the appropriate phone numbers to call if they have any and questions or concerns.

## 2024-06-06 NOTE — Patient Instructions (Signed)
 SURGICAL WAITING ROOM VISITATION Patients having surgery or a procedure may have no more than 2 support people in the waiting area - these visitors may rotate in the visitor waiting room.   If the patient needs to stay at the hospital during part of their recovery, the visitor guidelines for inpatient rooms apply.  PRE-OP VISITATION  Pre-op nurse will coordinate an appropriate time for 1 support person to accompany the patient in pre-op.  This support person may not rotate.  This visitor will be contacted when the time is appropriate for the visitor to come back in the pre-op area.  Please refer to the [facility] website for the visitor guidelines for Inpatients (after your surgery is over and you are in a regular room).  Temporary Visitor Restrictions  Children ages 78 and under will not be able to visit patients in [facility] under most circumstances. Visitation is not restricted outside of hospitals unless noted otherwise in the [facility] and Location Specific Visitation Guidelines at :      [email or web address] Visitors with respiratory illnesses are discouraged from visiting and should remain at home.  You are not required to quarantine at this time prior to your surgery. However, you must do this: Hand Hygiene often Do NOT share personal items Notify your provider if you are in close contact with someone who has COVID or you develop fever 100.4 or greater, new onset of sneezing, cough, sore throat, shortness of breath or body aches.  If you test positive for Covid or have been in contact with anyone that has tested positive in the last 10 days please notify you surgeon.    Your procedure is scheduled on:  MONDAY  06-13-24  Report to [facility] Main Entrance: [address] entrance where the [employer] is available.   Report to admitting at:  05:15   AM  Call this number if you have any questions or problems the morning of surgery [phone]  Do not eat food  after Midnight the night prior to your surgery/procedure.  After Midnight you may have the following liquids until    04:15 AM DAY OF SURGERY  Clear Liquid Diet Water Black Coffee (sugar ok, NO MILK/CREAM OR CREAMERS)  Tea (sugar ok, NO MILK/CREAM OR CREAMERS) regular and decaf                             Plain Jell-O  with no fruit (NO RED)                                           Fruit ices (not with fruit pulp, NO RED)                                     Popsicles (NO RED)                                                                  Juice: NO CITRUS JUICES: only apple, WHITE grape, WHITE cranberry Sports drinks like Gatorade or Powerade (NO RED)  The day of surgery:  Drink ONE (1) Pre-Surgery G2 at  04:15 AM the morning of surgery. Drink in one sitting. Do not sip.  This drink was given to you during your hospital pre-op appointment visit. Nothing else to drink after completing the Pre-Surgery G2 : No candy, chewing gum or throat lozenges.    FOLLOW ANY ADDITIONAL PRE OP INSTRUCTIONS YOU RECEIVED FROM YOUR SURGEON'S OFFICE!!!   Oral Hygiene is also important to reduce your risk of infection.        Remember - BRUSH YOUR TEETH THE MORNING OF SURGERY WITH YOUR REGULAR TOOTHPASTE  Do NOT smoke after Midnight the night before surgery.  STOP TAKING all Vitamins, Herbs and supplements 1 week before your surgery.   Tirzepatide (MOUNJARO) last injection: 06-03-2024 per Dr. Chiquita instructions METFORMIN- Day BEFORE surgery, take as usual. DO NOT TAKE METFORMIN THE DAY OF YOUR SURGERY.   Take ONLY these medicines the morning of surgery with A SIP OF WATER: Pramipexole (Mirapex) and Tylenol  if needed.   DO NOT TAKE HYDROCHLOROTHIAZIDE (HCTZ) the morning of your surgery.   If You have been diagnosed with Sleep Apnea - Bring CPAP mask and tubing day of surgery. We will provide you with a CPAP machine on the day of your surgery.                   You may not  have any metal on your body including hair pins, jewelry, and body piercing  Do not wear make-up, lotions, powders, perfumes or deodorant  Do not wear nail polish including gel and S&S, artificial / acrylic nails, or any other type of covering on natural nails including finger and toenails. If you have artificial nails, gel coating, etc., that needs to be removed by a nail salon, Please have this removed prior to surgery. Not doing so may mean that your surgery could be cancelled or delayed if the Surgeon or anesthesia staff feels like they are unable to monitor you safely.   Do not shave 48 hours prior to surgery to avoid nicks in your skin which may contribute to postoperative infections.   Contacts, Hearing Aids, dentures or bridgework may not be worn into surgery. DENTURES WILL BE REMOVED PRIOR TO SURGERY PLEASE DO NOT APPLY Poly grip OR ADHESIVES!!!  You may bring a small overnight bag with you on the day of surgery, only pack items that are not valuable. [facility] IS NOT RESPONSIBLE   FOR VALUABLES THAT ARE LOST OR STOLEN.   Do not bring your home medications to the hospital. The Pharmacy will dispense medications listed on your medication list to you during your admission in the Hospital.   Please read over the following fact sheets you were given: IF YOU HAVE QUESTIONS ABOUT YOUR PRE-OP INSTRUCTIONS, PLEASE CALL [phone].      Pre-operative 4 CHG Bath Instructions   You can play a key role in reducing the risk of infection after surgery. Your skin needs to be as free of germs as possible. You can reduce the number of germs on your skin by washing with CHG (chlorhexidine gluconate) soap before surgery. CHG is an antiseptic soap that kills germs and continues to kill germs even after washing.   DO NOT use if you have an allergy to chlorhexidine/CHG or antibacterial soaps. If your skin becomes reddened or irritated, stop using the CHG and notify one of our RNs at  [phone]  Please shower with the CHG soap starting 4 days before  surgery using the following schedule: THURSDAY  06-09-2024     Do NOT use CHG soap                                                                                                                      the morning of your                                                                                                                                 surgery.         Please keep in mind the following:  DO NOT shave, including legs and underarms, starting the day of your first shower.   You may shave your face at any point before/day of surgery.  Place clean sheets on your bed the day you start using CHG soap. Use a clean washcloth (not used since being washed) for each shower. DO NOT sleep with pets once you start using the CHG.  CHG Shower Instructions:  If you choose to wash your hair and private area, wash first with your normal shampoo/soap.  After you use shampoo/soap, rinse your hair and body thoroughly to remove shampoo/soap residue.  Turn the water OFF and apply about 3 tablespoons (45 ml) of CHG soap to a CLEAN washcloth.  Apply CHG soap ONLY FROM YOUR NECK DOWN TO YOUR TOES (washing for 3-5 minutes)  DO NOT use CHG soap on face, private areas, open wounds, or sores.  Pay special attention to the area where your surgery is being performed.  If you are having back surgery, having someone wash your back for you may be helpful. Wait 2 minutes after CHG soap is applied, then you may rinse off the CHG soap.  Pat dry with a clean towel  Put on clean clothes/pajamas   If you choose to wear lotion, please use ONLY the CHG-compatible lotions on the back of this paper.     Additional instructions for the day of surgery: DO NOT APPLY any CHG Soap,  lotions, deodorants, cologne, or perfumes on the day of surgery  Put on clean/comfortable clothes.  Brush your teeth.  Ask your nurse before applying any prescription medications  to the skin.   CHG Compatible Lotions   Aveeno Moisturizing lotion  Cetaphil Moisturizing Cream  Cetaphil Moisturizing Lotion  Clairol Herbal Essence Moisturizing Lotion, Dry Skin  Clairol Herbal Essence Moisturizing Lotion, Extra Dry Skin  Clairol Herbal Essence Moisturizing Lotion,  Normal Skin  Curel Age Defying Therapeutic Moisturizing Lotion with Alpha Hydroxy  Curel Extreme Care Body Lotion  Curel Soothing Hands Moisturizing Hand Lotion  Curel Therapeutic Moisturizing Cream, Fragrance-Free  Curel Therapeutic Moisturizing Lotion, Fragrance-Free  Curel Therapeutic Moisturizing Lotion, Original Formula  Eucerin Daily Replenishing Lotion  Eucerin Dry Skin Therapy Plus Alpha Hydroxy Crme  Eucerin Dry Skin Therapy Plus Alpha Hydroxy Lotion  Eucerin Original Crme  Eucerin Original Lotion  Eucerin Plus Crme Eucerin Plus Lotion  Eucerin TriLipid Replenishing Lotion  Keri Anti-Bacterial Hand Lotion  Keri Deep Conditioning Original Lotion Dry Skin Formula Softly Scented  Keri Deep Conditioning Original Lotion, Fragrance Free Sensitive Skin Formula  Keri Lotion Fast Absorbing Fragrance Free Sensitive Skin Formula  Keri Lotion Fast Absorbing Softly Scented Dry Skin Formula  Keri Original Lotion  Keri Skin Renewal Lotion Keri Silky Smooth Lotion  Keri Silky Smooth Sensitive Skin Lotion  Nivea Body Creamy Conditioning Oil  Nivea Body Extra Enriched Lotion  Nivea Body Original Lotion  Nivea Body Sheer Moisturizing Lotion Nivea Crme  Nivea Skin Firming Lotion  NutraDerm 30 Skin Lotion  NutraDerm Skin Lotion  NutraDerm Therapeutic Skin Cream  NutraDerm Therapeutic Skin Lotion  ProShield Protective Hand Cream  Provon moisturizing lotion   FAILURE TO FOLLOW THESE INSTRUCTIONS MAY RESULT IN THE CANCELLATION OF YOUR SURGERY  PATIENT SIGNATURE_________________________________  NURSE  SIGNATURE__________________________________  ________________________________________________________________________       Nasario Exon    An incentive spirometer is a tool that can help keep your lungs clear and active. This tool measures how well you are filling your lungs with each breath. Taking long deep breaths may help reverse or decrease the chance of developing breathing (pulmonary) problems (especially infection) following: A long period of time when you are unable to move or be active. BEFORE THE PROCEDURE  If the spirometer includes an indicator to show your best effort, your nurse or respiratory therapist will set it to a desired goal. If possible, sit up straight or lean slightly forward. Try not to slouch. Hold the incentive spirometer in an upright position. INSTRUCTIONS FOR USE  Sit on the edge of your bed if possible, or sit up as far as you can in bed or on a chair. Hold the incentive spirometer in an upright position. Breathe out normally. Place the mouthpiece in your mouth and seal your lips tightly around it. Breathe in slowly and as deeply as possible, raising the piston or the ball toward the top of the column. Hold your breath for 3-5 seconds or for as long as possible. Allow the piston or ball to fall to the bottom of the column. Remove the mouthpiece from your mouth and breathe out normally. Rest for a few seconds and repeat Steps 1 through 7 at least 10 times every 1-2 hours when you are awake. Take your time and take a few normal breaths between deep breaths. The spirometer may include an indicator to show your best effort. Use the indicator as a goal to work toward during each repetition. After each set of 10 deep breaths, practice coughing to be sure your lungs are clear. If you have an incision (the cut made at the time of surgery), support your incision when coughing by placing a pillow or rolled up towels firmly against it. Once you are able to  get out of bed, walk around indoors and cough well. You may stop using the incentive spirometer when instructed by your caregiver.  RISKS AND COMPLICATIONS Take your time so  you do not get dizzy or light-headed. If you are in pain, you may need to take or ask for pain medication before doing incentive spirometry. It is harder to take a deep breath if you are having pain. AFTER USE Rest and breathe slowly and easily. It can be helpful to keep track of a log of your progress. Your caregiver can provide you with a simple table to help with this. If you are using the spirometer at home, follow these instructions: SEEK MEDICAL CARE IF:  You are having difficultly using the spirometer. You have trouble using the spirometer as often as instructed. Your pain medication is not giving enough relief while using the spirometer. You develop fever of 100.5 F (38.1 C) or higher.                                                                                                    SEEK IMMEDIATE MEDICAL CARE IF:  You cough up bloody sputum that had not been present before. You develop fever of 102 F (38.9 C) or greater. You develop worsening pain at or near the incision site. MAKE SURE YOU:  Understand these instructions. Will watch your condition. Will get help right away if you are not doing well or get worse. Document Released: 10/27/2006 Document Revised: 09/08/2011 Document Reviewed: 12/28/2006 [facility] Patient Information 2014 [place], [place].       If you would like to see a video about joint replacement:   [email or web address]

## 2024-06-07 ENCOUNTER — Encounter (HOSPITAL_COMMUNITY)
Admission: RE | Admit: 2024-06-07 | Discharge: 2024-06-07 | Disposition: A | Source: Ambulatory Visit | Attending: Orthopedic Surgery

## 2024-06-07 ENCOUNTER — Encounter (HOSPITAL_COMMUNITY): Payer: Self-pay

## 2024-06-07 DIAGNOSIS — M1711 Unilateral primary osteoarthritis, right knee: Secondary | ICD-10-CM

## 2024-06-07 DIAGNOSIS — Z79899 Other long term (current) drug therapy: Secondary | ICD-10-CM

## 2024-06-07 DIAGNOSIS — E119 Type 2 diabetes mellitus without complications: Secondary | ICD-10-CM

## 2024-06-07 DIAGNOSIS — Z01818 Encounter for other preprocedural examination: Secondary | ICD-10-CM

## 2024-06-13 ENCOUNTER — Ambulatory Visit (HOSPITAL_COMMUNITY): Admit: 2024-06-13 | Admitting: Orthopedic Surgery

## 2024-06-13 SURGERY — ARTHROPLASTY, KNEE, TOTAL
Anesthesia: Choice | Site: Knee | Laterality: Right

## 2024-06-27 ENCOUNTER — Ambulatory Visit: Admit: 2024-06-27 | Admitting: Orthopedic Surgery

## 2024-06-27 SURGERY — ARTHROPLASTY, KNEE, TOTAL
Anesthesia: Choice | Site: Knee | Laterality: Right
